# Patient Record
Sex: Male | Born: 1995 | Race: Black or African American | Hispanic: No | Marital: Single | Smoking: Never smoker
Health system: Southern US, Community
[De-identification: ages and names within clinical notes are randomized; demographics above are authoritative.]

## PROBLEM LIST (undated history)

## (undated) HISTORY — PX: NO PAST SURGERIES: SHX2092

---

## 2014-06-01 ENCOUNTER — Emergency Department (HOSPITAL_COMMUNITY)
Admission: EM | Admit: 2014-06-01 | Discharge: 2014-06-01 | Disposition: A | Discharge status: Home / Self Care | Payer: Federal, State, Local not specified - PPO | Attending: Emergency Medicine | Admitting: Emergency Medicine

## 2014-06-01 ENCOUNTER — Encounter (HOSPITAL_COMMUNITY): Payer: Self-pay | Admitting: Emergency Medicine

## 2014-06-01 ENCOUNTER — Emergency Department (HOSPITAL_COMMUNITY): Payer: Federal, State, Local not specified - PPO

## 2014-06-01 DIAGNOSIS — Y929 Unspecified place or not applicable: Secondary | ICD-10-CM | POA: Insufficient documentation

## 2014-06-01 DIAGNOSIS — Y9389 Activity, other specified: Secondary | ICD-10-CM | POA: Diagnosis not present

## 2014-06-01 DIAGNOSIS — S4992XA Unspecified injury of left shoulder and upper arm, initial encounter: Secondary | ICD-10-CM | POA: Diagnosis present

## 2014-06-01 DIAGNOSIS — X58XXXA Exposure to other specified factors, initial encounter: Secondary | ICD-10-CM | POA: Diagnosis not present

## 2014-06-01 DIAGNOSIS — S46912A Strain of unspecified muscle, fascia and tendon at shoulder and upper arm level, left arm, initial encounter: Secondary | ICD-10-CM | POA: Insufficient documentation

## 2014-06-01 MED ORDER — CYCLOBENZAPRINE HCL 10 MG PO TABS: 10.0000 mg | ORAL_TABLET | Freq: Two times a day (BID) | ORAL | Status: AC | PRN

## 2014-06-01 MED ORDER — NAPROXEN 500 MG PO TABS: 500.0000 mg | ORAL_TABLET | Freq: Two times a day (BID) | ORAL | Status: AC

## 2014-06-01 NOTE — ED Notes (Signed)
Pt shoulder pain x 4 days.  Pain score 4/10.  Denies injury.  Denies new exercises or activities.  Sts decreased ROM and intermittent tinging.

## 2014-06-01 NOTE — Discharge Instructions (Signed)
Naprosyn for pain. Flexeril for spasms. Sling for comfort. No physical activity. Follow up with orthopedics specialist.   Shoulder Sprain A shoulder sprain is the result of damage to the tough, fiber-like tissues (ligaments) that help hold your shoulder in place. The ligaments may be stretched or torn. Besides the main shoulder joint (the ball and socket), there are several smaller joints that connect the bones in this area. A sprain usually involves one of those joints. Most often it is the acromioclavicular (or AC) joint. That is the joint that connects the collarbone (clavicle) and the shoulder blade (scapula) at the top point of the shoulder blade (acromion). A shoulder sprain is a mild form of what is called a shoulder separation. Recovering from a shoulder sprain may take some time. For some, pain lingers for several months. Most people recover without long term problems. CAUSES   A shoulder sprain is usually caused by some kind of trauma. This might be:  Falling on an outstretched arm.  Being hit hard on the shoulder.  Twisting the arm.  Shoulder sprains are more likely to occur in people who:  Play sports.  Have balance or coordination problems. SYMPTOMS   Pain when you move your shoulder.  Limited ability to move the shoulder.  Swelling and tenderness on top of the shoulder.  Redness or warmth in the shoulder.  Bruising.  A change in the shape of the shoulder. DIAGNOSIS  Your healthcare provider may:  Ask about your symptoms.  Ask about recent activity that might have caused those symptoms.  Examine your shoulder. You may be asked to do simple exercises to test movement. The other shoulder will be examined for comparison.  Order some tests that provide a look inside the body. They can show the extent of the injury. The tests could include:  X-rays.  CT (computed tomography) scan.  MRI (magnetic resonance imaging) scan. RISKS AND COMPLICATIONS  Loss of full  shoulder motion.  Ongoing shoulder pain. TREATMENT  How long it takes to recover from a shoulder sprain depends on how severe it was. Treatment options may include:  Rest. You should not use the arm or shoulder until it heals.  Ice. For 2 or 3 days after the injury, put an ice pack on the shoulder up to 4 times a day. It should stay on for 15 to 20 minutes each time. Wrap the ice in a towel so it does not touch your skin.  Over-the-counter medicine to relieve pain.  A sling or brace. This will keep the arm still while the shoulder is healing.  Physical therapy or rehabilitation exercises. These will help you regain strength and motion. Ask your healthcare provider when it is OK to begin these exercises.  Surgery. The need for surgery is rare with a sprained shoulder, but some people may need surgery to keep the joint in place and reduce pain. HOME CARE INSTRUCTIONS   Ask your healthcare provider about what you should and should not do while your shoulder heals.  Make sure you know how to apply ice to the correct area of your shoulder.  Talk with your healthcare provider about which medications should be used for pain and swelling.  If rehabilitation therapy will be needed, ask your healthcare provider to refer you to a therapist. If it is not recommended, then ask about at-home exercises. Find out when exercise should begin. SEEK MEDICAL CARE IF:  Your pain, swelling, or redness at the joint increases. SEEK IMMEDIATE MEDICAL CARE  IF:   You have a fever.  You cannot move your arm or shoulder. Document Released: 12/16/2008 Document Revised: 10/22/2011 Document Reviewed: 12/16/2008 [facility] Patient Information 2015 [employer], [place]. This information is not intended to replace advice given to you by your health care provider. Make sure you discuss any questions you have with your health care provider.

## 2014-06-01 NOTE — ED Provider Notes (Signed)
CSN: [id number]     Arrival date & time 06/01/14  0903 History   First MD Initiated Contact with Patient 06/01/14 1003     Chief Complaint  Patient presents with  . Shoulder Pain     (Consider location/radiation/quality/duration/timing/severity/associated sxs/prior Treatment) HPI Dale Gilmore is a 18 y.o. male who presents emergency department complaining of left shoulder pain. 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 States unsure if he injured it. Does not remember any incidents that he can think of. He states pain started 4 days ago. Yesterday pain worsened. He states he has not worked out in the last 4 days. States this morning when he woke up he could not raise his arm. He reports intermittent tingling in his left upper arm, states that has been going on prior to this injury. He denies any neck pain. No current numbness or tingling. He denies any prior shoulder or neck injuries. He states he did play football and fights so he has had multiple minor injuries to does not know if this is related.  History reviewed. No pertinent past medical history. History reviewed. No pertinent past surgical history. History reviewed. No pertinent family history. History  Substance Use Topics  . Smoking status: Never Smoker   . Smokeless tobacco: Not on file  . Alcohol Use: Yes     Comment: occ    Review of Systems  Constitutional: Negative for fever and chills.  Respiratory: Negative for cough, chest tightness and shortness of breath.   Cardiovascular: Negative for chest pain, palpitations and leg swelling.  Musculoskeletal: Positive for arthralgias and joint swelling. Negative for back pain, neck pain and neck stiffness.  Skin: Negative for rash.  Allergic/Immunologic: Negative for immunocompromised state.  Neurological: Positive for numbness. Negative for weakness.      Allergies  Review of patient's allergies indicates not on file.  Home Medications    Prior to Admission medications   Not on File   BP 149/70  Pulse 61  Temp(Src) 98.2 F (36.8 C) (Oral)  Resp 16  SpO2 96% Physical Exam  Nursing note and vitals reviewed. Constitutional: He appears well-developed and well-nourished. No distress.  HENT:  Head: Normocephalic.  Neck: Normal range of motion. Neck supple.  No midline cervical spine tenderness. Full rom of the neck  Cardiovascular: Normal rate, regular rhythm and normal heart sounds.   Pulmonary/Chest: Effort normal and breath sounds normal. No respiratory distress. He has no wheezes. He has no rales.  Abdominal: Soft. There is no tenderness.  Musculoskeletal:  Normal appearing left shoulder. Tender to palpation over anterior joint. No tenderness over posterior joint or acromion. Almost following emotion passively. Unable to raise his arm or abduct his arm past 90 actively. Positive straight arm drop test. Pain with external rotation, minimal pain internal rotation. Patient can put his arm behind his back. Distal radial pulses intact. Bicep and tricep tendon/muscle is intact with normal strength against resistance. Normal sensation in all dermatomes of the hand.  Skin: Skin is warm and dry.    ED Course  Procedures (including critical care time) Labs Review Labs Reviewed - No data to display  Imaging Review Dg Shoulder Left  06/01/2014   CLINICAL DATA:  Left shoulder injury secondary to weight lifting now with generalized left shoulder pain and limited range of motion  EXAM: LEFT SHOULDER - 2+ VIEW  COMPARISON:  None.  FINDINGS: The bones are adequately mineralized. There is no acute fracture nor dislocation. There is  no significant degenerative change. The observed portions of the left clavicle and upper left ribs are normal.  IMPRESSION: There is no acute bony abnormality of the left shoulder.   Electronically Signed   By: David  Swazil[place]   On: 06/01/2014 10:02     EKG Interpretation None      MDM   Final  diagnoses:  Shoulder strain, left, initial encounter   Patient is very active and athletic, left shoulder injury 4 days ago. I'm sure how he injured it. At this time he has limited range of motion actively, pain with external rotation. Neurovascularly intact. X-ray obtained and is negative. Question rotator cuff injury. Will offer a sling, will start her muscle relaxant and anti-inflammatories. Followup with orthopedic specialist if not improving after one week rest.   Filed Vitals:   06/01/14 0934  BP: 149/70  Pulse: 61  Temp: 98.2 F (36.8 C)  TempSrc: Oral  Resp: 16  SpO2: 96%      Sailor Haughn A Tristine Langi, PA-C 06/01/14 1816

## 2014-06-01 NOTE — ED Provider Notes (Signed)
Medical screening examination/treatment/procedure(s) were performed by non-physician practitioner and as supervising physician I was immediately available for consultation/collaboration.   EKG Interpretation None        Shon Batonourtney F Geramy Lamorte, MD 06/01/14 1819

## 2017-07-10 NOTE — Progress Notes (Signed)
Cardiology Office Note    Date:  07/11/2017   ID:  Dale Gilmore, DOB 04-09-1996, MRN [id number]  PCP:  No primary care provider on file.  Cardiologist:  New to [facility] - Dr. Claiborne Billings  Chief Complaint  Patient presents with  . New Patient (Initial Visit)    evaluation of chest pain     History of Present Illness:    Dale Gilmore is a 21 y.o. male with no significant past medical history who presents to the office today for evaluation of chest pain at the request of Dr. Antoine Primas.   In talking with the patient today, he denies any prior cardiac history or past medical history. He has always been very active at baseline and reports playing football and running track throughout high school. He is currently a Equities trader at [employer] and runs 5-6 miles per day and lifts weights on a routine basis. 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  Approximately 6 weeks ago, he started to develop chest discomfort with exertion. He reports this would occur when he was at the highest intensity of his exercise and could last for 30 minutes up to 2 days. He has noticed associated dyspnea. Denies any palpitations, lightheadedness, dizziness, or presyncope. No recent orthopnea, PND, lower extremity edema, or leg pain. Pain was not made worse with positional changes or lying down. He initially tried icing his left pectoral region due to thinking it was a musculoskeletal issue but experienced no improvement in his symptoms with this.   He reports that his mother has a known heart murmur along with high cholesterol but she is adopted and therefore information about his grandparents is not available. He is unaware of his father's medical history. He denies any prior cardiac history as a child. No history of surgeries. He denies any tobacco use or recreational drug use. He does consume alcohol on a social basis. No recent illnesses.   History  reviewed. No pertinent past medical history.  Past Surgical History:  Procedure Laterality Date  . NO PAST SURGERIES      Current Medications: Outpatient Medications Prior to Visit  Medication Sig Dispense Refill  . cyclobenzaprine (FLEXERIL) 10 MG tablet Take 1 tablet (10 mg total) by mouth 2 (two) times daily as needed for muscle spasms. 20 tablet 0  . naproxen (NAPROSYN) 500 MG tablet Take 1 tablet (500 mg total) by mouth 2 (two) times daily. 30 tablet 0   No facility-administered medications prior to visit.      Allergies:   Patient has no allergy information on record.   Social History   Socioeconomic History  . Marital status: Single    Spouse name: None  . Number of children: None  . Years of education: None  . Highest education level: None  Social Needs  . Financial resource strain: None  . Food insecurity - worry: None  . Food insecurity - inability: None  . Transportation needs - medical: None  . Transportation needs - non-medical: None  Occupational History  . None  Tobacco Use  . Smoking status: Never Smoker  . Smokeless tobacco: Never Used  Substance and Sexual Activity  . Alcohol use: Yes    Comment: occ  . Drug use: No  . Sexual activity: None  Other Topics Concern  . None  Social History Narrative  . None     Family History:  The patient's  family history includes Heart murmur in his mother and sister; Hyperlipidemia in his mother.   Review of Systems:   Please see the history of present illness.     General:  No chills, fever, night sweats or weight changes.  Cardiovascular:  No dyspnea on exertion, edema, orthopnea, palpitations, paroxysmal nocturnal dyspnea. Positive for chest pain.  Dermatological: No rash, lesions/masses Respiratory: No cough, dyspnea Urologic: No hematuria, dysuria Abdominal:   No nausea, vomiting, diarrhea, bright red blood per rectum, melena, or hematemesis Neurologic:  No visual changes, wkns, changes in mental  status. All other systems reviewed and are otherwise negative except as noted above.   Physical Exam:    VS:  BP 116/80 (BP Location: Right Arm)   Pulse 66   Ht '5\' 11"'$  (1.803 m)   Wt 195 lb (88.5 kg)   BMI 27.20 kg/m    General: Well developed, well nourished [place] American male appearing in no acute distress. Head: Normocephalic, atraumatic, sclera non-icteric, no xanthomas, nares are without discharge.  Neck: No carotid bruits. JVD not elevated.  Lungs: Respirations regular and unlabored, without wheezes or rales.  Heart: Regular rate and rhythm. No S3 or S4.  No murmur, no rubs, or gallops appreciated. Abdomen: Soft, non-tender, non-distended with normoactive bowel sounds. No hepatomegaly. No rebound/guarding. No obvious abdominal masses. Msk:  Strength and tone appear normal for age. No joint deformities or effusions. Extremities: No clubbing or cyanosis. No lower extremity edema.  Distal pedal pulses are 2+ bilaterally. Neuro: Alert and oriented X 3. Moves all extremities spontaneously. No focal deficits noted. Psych:  Responds to questions appropriately with a normal affect. Skin: No rashes or lesions noted  Wt Readings from Last 3 Encounters:  07/11/17 195 lb (88.5 kg)     Studies/Labs Reviewed:   EKG:  EKG is ordered today.The ekg ordered today demonstrates NSR, HR 66, with mild LVH and slight up-sloping of the ST segment along V2-V6 (similar to prior tracing from 07/10/17)  Recent Labs: No results found for requested labs within last 8760 hours.   Lipid Panel No results found for: CHOL, TRIG, HDL, CHOLHDL, VLDL, LDLCALC, LDLDIRECT  Additional studies/ records that were reviewed today include:   No prior studies available for review.   Assessment:    1. Precordial pain   2. Dyspnea on exertion      Plan:   In order of problems listed above:  1. Precordial Chest Pain/ Dyspnea on Exertion - the patient has no prior cardiac history and family history is  limited due to the patient being unaware of his father's history. He has been experiencing chest pain with activity for the past several weeks which only comes on with strenuous activity. He has still been able to run and lift weights without the symptoms recurring. The pain is not exacerbated with positional changes and can last for 30 minutes up to 2 days. He does travel regularly but he denies any leg pain and his respiratory status has been at baseline except for mild dyspnea when the pain occurs.  - overall, his symptoms seem atypical for pericarditis but will check ESR and CRP to rule this out. Doubt an ischemic etiology in a patient this young of age and active lifestyle. Will obtain an echocardiogram to rule out structural abnormalities and to assess for LVH as his EKG shows evidence of early repolarization abnormalities. If echo is without abnormalities and symptoms persist, consider a GXT. With the patient being new to our practice, this was  reviewed with Dr. Claiborne Billings (DOD) who was in agreement with the assessment and plan.    Medication Adjustments/Labs and Tests Ordered: Current medicines are reviewed at length with the patient today.  Concerns regarding medicines are outlined above.  Medication changes, Labs and Tests ordered today are listed in the Patient Instructions below. Patient Instructions  Medication Instructions:  Your physician recommends that you continue on your current medications as directed. Please refer to the Current Medication list given to you today.  Labwork: Your physician recommends that you return for lab work in: TODAY-ESR, CRP   Testing/Procedures: Your physician has requested that you have an echocardiogram. Echocardiography is a painless test that uses sound waves to create images of your heart. It provides your doctor with information about the size and shape of your heart and how well your heart's chambers and valves are working. This procedure takes  approximately one hour. There are no restrictions for this procedure.  [place]  Follow-Up: Follow up with Dr Claiborne Billings as needed unless ECHO comes back abnormal   Any Other Special Instructions Will Be Listed Below (If Applicable).  If you need a refill on your cardiac medications before your next appointment, please call your pharmacy.   Signed, Erma Heritage, PA-C  07/11/2017 7:11 PM    [facility] [address], [place], [place]  [address] Phone: [phone]; Fax: [phone]  [address], [place],  [address] Phone: [phone]

## 2017-07-11 ENCOUNTER — Ambulatory Visit (INDEPENDENT_AMBULATORY_CARE_PROVIDER_SITE_OTHER): Payer: Federal, State, Local not specified - PPO | Admitting: Student

## 2017-07-11 ENCOUNTER — Encounter: Payer: Self-pay | Admitting: Student

## 2017-07-11 VITALS — BP 116/80 | HR 66 | Ht 71.0 in | Wt 195.0 lb

## 2017-07-11 DIAGNOSIS — R072 Precordial pain: Secondary | ICD-10-CM

## 2017-07-11 DIAGNOSIS — R0609 Other forms of dyspnea: Secondary | ICD-10-CM

## 2017-07-11 NOTE — Patient Instructions (Signed)
Medication Instructions:  Your physician recommends that you continue on your current medications as directed. Please refer to the Current Medication list given to you today.  Labwork: Your physician recommends that you return for lab work in: TODAY-ESR, CRP   Testing/Procedures: Your physician has requested that you have an echocardiogram. Echocardiography is a painless test that uses sound waves to create images of your heart. It provides your doctor with information about the size and shape of your heart and how well your heart's chambers and valves are working. This procedure takes approximately one hour. There are no restrictions for this procedure.  [address]  Follow-Up: Follow up with Dr Kelly as needed unless ECHO comes back abnormal   Any Other Special Instructions Will Be Listed Below (If Applicable).     If you need a refill on your cardiac medications before your next appointment, please call your pharmacy.   

## 2017-07-12 ENCOUNTER — Ambulatory Visit (HOSPITAL_COMMUNITY): Payer: Federal, State, Local not specified - PPO | Attending: Cardiology

## 2017-07-12 ENCOUNTER — Other Ambulatory Visit: Payer: Self-pay

## 2017-07-12 DIAGNOSIS — R072 Precordial pain: Secondary | ICD-10-CM | POA: Diagnosis not present

## 2017-07-12 DIAGNOSIS — R0600 Dyspnea, unspecified: Secondary | ICD-10-CM | POA: Diagnosis not present

## 2017-07-12 DIAGNOSIS — R0609 Other forms of dyspnea: Secondary | ICD-10-CM | POA: Insufficient documentation

## 2017-07-12 LAB — C-REACTIVE PROTEIN: CRP: <0.3 mg/L (ref 0.0–4.9)

## 2017-07-12 LAB — SEDIMENTATION RATE: Sed Rate: 5 mm/hr (ref 0–15)

## 2017-08-11 ENCOUNTER — Emergency Department (HOSPITAL_COMMUNITY): Payer: Federal, State, Local not specified - PPO

## 2017-08-11 ENCOUNTER — Other Ambulatory Visit: Payer: Self-pay

## 2017-08-11 ENCOUNTER — Emergency Department (HOSPITAL_COMMUNITY)
Admission: EM | Admit: 2017-08-11 | Discharge: 2017-08-11 | Disposition: A | Discharge status: Home / Self Care | Payer: Federal, State, Local not specified - PPO | Attending: Emergency Medicine | Admitting: Emergency Medicine

## 2017-08-11 ENCOUNTER — Encounter (HOSPITAL_COMMUNITY): Payer: Self-pay | Admitting: *Deleted

## 2017-08-11 DIAGNOSIS — R0602 Shortness of breath: Secondary | ICD-10-CM | POA: Insufficient documentation

## 2017-08-11 DIAGNOSIS — R0789 Other chest pain: Secondary | ICD-10-CM | POA: Diagnosis present

## 2017-08-11 DIAGNOSIS — R072 Precordial pain: Secondary | ICD-10-CM | POA: Diagnosis not present

## 2017-08-11 DIAGNOSIS — Z79899 Other long term (current) drug therapy: Secondary | ICD-10-CM | POA: Insufficient documentation

## 2017-08-11 LAB — CBC
HCT: 46.2 % (ref 39.0–52.0)
Hemoglobin: 16.1 g/dL (ref 13.0–17.0)
MCH: 32.8 pg (ref 26.0–34.0)
MCHC: 34.8 g/dL (ref 30.0–36.0)
MCV: 94.1 fL (ref 78.0–100.0)
Platelets: 235 10*3/uL (ref 150–400)
RBC: 4.91 MIL/uL (ref 4.22–5.81)
RDW: 11.8 % (ref 11.5–15.5)
WBC: 7.3 10*3/uL (ref 4.0–10.5)

## 2017-08-11 LAB — BASIC METABOLIC PANEL
Anion gap: 7 (ref 5–15)
BUN: 14 mg/dL (ref 6–20)
CO2: 29 mmol/L (ref 22–32)
Calcium: 9.7 mg/dL (ref 8.9–10.3)
Chloride: 101 mmol/L (ref 101–111)
Creatinine, Ser: 1.05 mg/dL (ref 0.61–1.24)
GFR calc Af Amer: >60 mL/min (ref >60)
GFR calc non Af Amer: >60 mL/min (ref >60)
Glucose, Bld: 88 mg/dL (ref 65–99)
Potassium: 4.2 mmol/L (ref 3.5–5.1)
Sodium: 137 mmol/L (ref 135–145)

## 2017-08-11 LAB — D-DIMER, QUANTITATIVE (NOT AT ARMC)

## 2017-08-11 LAB — I-STAT TROPONIN, ED: Troponin i, poc: 0 ng/mL (ref 0.00–0.08)

## 2017-08-11 NOTE — ED Triage Notes (Signed)
Pt has had chest pain for over a month, has been to cardiology and nothing was noted, pain is reproducible and more muscular in description, Increased pain with movement and deep breaths, pain worse last night, he feels there is something wrong and had been overlooked.

## 2017-08-11 NOTE — ED Provider Notes (Signed)
[facility] DEPT Provider Note   CSN: [id number] Arrival date & time: 08/11/17  1058     History   Chief Complaint Chief Complaint  Patient presents with  . Chest Pain    HPI Dale Gilmore is a 21 y.o. male.  The history is provided by the patient and medical records. No language interpreter was used.  Chest Pain   Associated symptoms include shortness of breath. Pertinent negatives include no palpitations.   Dale Gilmore is an otherwise healthy 21 y.o. male who presents to the Emergency Department complaining of intermittent left-sided chest pain over the last 2 months.  When pain occurs, he feels short of breath. Patient states the pain is worse with movement and when he takes a deep breath.  Initially, this was happening every 3-4 days, however over the last week, he has been experiencing this pain daily, mostly at night when he gets to bed.  At first, he thought this was just a sore muscle, therefore was taking over-the-counter pain medication and applying heat to the area.  He was also trying massage, however this was not helping. He was seen at outside emergency department initially and told everything was fine. He was still experiencing symptoms, therefore scheduled an appointment with a cardiologist.  He states they took lab work and did an ultrasound which was normal.  Per chart review, ESR and CRP were done to rule out pericarditis-these were normal.  Echo was also done which was normal.  It appears per chart reviewed that if this workup was negative and symptoms persisted, they would consider getting an exercise stress test. No leg pain or swelling. No recent surgeries or immobilizations.  No history of PE/DVT or clotting disorder.  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  Most recently he flew to the [place]3 weeks ago.   History reviewed. No pertinent past medical history.  There are no active  problems to display for this patient.   Past Surgical History:  Procedure Laterality Date  . NO PAST SURGERIES         Home Medications    Prior to Admission medications   Medication Sig Start Date End Date Taking? Authorizing Provider  cyclobenzaprine (FLEXERIL) 10 MG tablet Take 1 tablet (10 mg total) by mouth 2 (two) times daily as needed for muscle spasms. 06/01/14   Kirichenko, Tatyana, PA-C  naproxen (NAPROSYN) 500 MG tablet Take 1 tablet (500 mg total) by mouth 2 (two) times daily. 06/01/14   Jeannett Senior, PA-C    Family History Family History  Problem Relation Age of Onset  . Hyperlipidemia Mother   . Heart murmur Mother   . Heart murmur Sister     Social History Social History   Tobacco Use  . Smoking status: Never Smoker  . Smokeless tobacco: Never Used  Substance Use Topics  . Alcohol use: Yes    Comment: occ  . Drug use: No     Allergies   Patient has no known allergies.   Review of Systems Review of Systems  Respiratory: Positive for shortness of breath.   Cardiovascular: Positive for chest pain. Negative for palpitations and leg swelling.  All other systems reviewed and are negative.    Physical Exam Updated Vital Signs BP (!) 126/57 (BP Location: Right Arm)   Pulse (!) 58   Temp 98.2 F (36.8 C) (Oral)   Resp 18   Ht _0  (1.803 m)   Wt  88.5 kg (195 lb)   SpO2 99%   BMI 27.20 kg/m   Physical Exam  Constitutional: He is oriented to person, place, and time. He appears well-developed and well-nourished. No distress.  HENT:  Head: Normocephalic and atraumatic.  Neck: Neck supple. No JVD present.  Cardiovascular: Normal rate, regular rhythm and normal heart sounds.  No murmur heard. Pulmonary/Chest: Effort normal and breath sounds normal. No stridor. No respiratory distress. He has no wheezes. He has no rales. He exhibits tenderness (Left side of the chest).  Abdominal: Soft. He exhibits no distension. There is no tenderness.   Musculoskeletal: He exhibits no edema.  Neurological: He is alert and oriented to person, place, and time.  Skin: Skin is warm and dry.  Nursing note and vitals reviewed.    ED Treatments / Results  Labs (all labs ordered are listed, but only abnormal results are displayed) Labs Reviewed  BASIC METABOLIC PANEL  CBC  D-DIMER, QUANTITATIVE (NOT AT [facility])  I-STAT TROPONIN, ED    EKG  EKG Interpretation  Date/Time:  Sunday August 11 2017 11:10:26 EST Ventricular Rate:  64 PR Interval:    QRS Duration: 79 QT Interval:  389 QTC Calculation: 402 R Axis:   67 Text Interpretation:  Sinus rhythm ST elev, probable normal early repol pattern No significant change since last tracing 07/10/17 Confirmed by Virgel Manifold [phone]) on 08/11/2017 12:19:07 PM       Radiology Dg Chest 2 View  Result Date: 08/11/2017 CLINICAL DATA:  Left-sided chest pain for 6 weeks. EXAM: CHEST  2 VIEW COMPARISON:  None FINDINGS: The heart size and mediastinal contours are within normal limits. Both lungs are clear. The visualized skeletal structures are unremarkable. IMPRESSION: No active cardiopulmonary disease. Electronically Signed   By: Kerby Moors M.D.   On: 08/11/2017 11:29    Procedures Procedures (including critical care time)  Medications Ordered in ED Medications - No data to display   Initial Impression / Assessment and Plan / ED Course  I have reviewed the triage vital signs and the nursing notes.  Pertinent labs & imaging results that were available during my care of the patient were reviewed by me and considered in my medical decision making (see chart for details).    Dale Gilmore is a 21 y.o. male who presents to ED for chest pain associated with shortness of breath x 2 months. Tenderness to the left chest wall, but otherwise normal exam.   Labs reviewed and reassuring with negative troponin and d-dimer.   CXR with no acute abnormalities.  EKG unchanged from previous. Low  risk heart score.   Per chart review, it appears that cardiology was to consider exercise stress test if symptoms persisted and echo/ESR/CRP all returned normal.  Considering his persistent symptoms, will refer back to cardiology for further evaluation and possible stress test.  Evaluation does not show pathology that would require ongoing emergent intervention or inpatient treatment.Referral information included in discharge paperwork. Patient understands return precautions and follow up plan. All questions answered.  Final Clinical Impressions(s) / ED Diagnoses   Final diagnoses:  Precordial chest pain    ED Discharge Orders    None       Ward, Ozella Almond, PA-C 08/11/17 1354    Virgel Manifold, MD 08/12/17 [phone]

## 2017-08-11 NOTE — Discharge Instructions (Signed)
It was my pleasure taking care of you today!   You were seen in the Emergency Department today for chest pain.  As we have discussed, today?s blood work and imaging are normal, but you may require further testing.  Please call the cardiology listed to schedule a follow up appointment for further evaluation of the chest pain you are experiencing.   Return to the Emergency Department if you experience any further chest pain/pressure/tightness, difficulty breathing, sudden sweating, or other symptoms that concern you.

## 2017-08-12 ENCOUNTER — Telehealth: Payer: Self-pay | Admitting: Student

## 2017-08-12 DIAGNOSIS — R072 Precordial pain: Secondary | ICD-10-CM

## 2017-08-12 NOTE — Telephone Encounter (Signed)
New message  Pt verbalized that he was at the ER and that the following test below are needed. Please call pt he is trying to get them done today.  Per chart review, it appears that cardiology was to consider exercise stress test if symptoms persisted and echo/ESR/CRP all returned normal.  Considering his persistent symptoms, will refer back to cardiology for further evaluation and possible stress test.

## 2017-08-12 NOTE — Telephone Encounter (Signed)
Returned call to patient.He stated he went to ED yesterday with chest pain.Stated he continues to have chest pain this morning.Stated he was told he needs a stress test.Advised I will speak to DOD Dr.Hilty. Spoke to Dr.Hilty he advised schedule a treadmill test.Advised scheduler will call back to schedule.

## 2017-08-14 ENCOUNTER — Telehealth (HOSPITAL_COMMUNITY): Payer: Self-pay

## 2017-08-14 NOTE — Telephone Encounter (Signed)
UTR Encounter complete. 

## 2017-08-15 ENCOUNTER — Telehealth (HOSPITAL_COMMUNITY): Payer: Self-pay

## 2017-08-15 NOTE — Telephone Encounter (Signed)
Encounter complete. 

## 2017-08-16 ENCOUNTER — Encounter (HOSPITAL_COMMUNITY): Payer: Self-pay | Admitting: *Deleted

## 2017-08-16 ENCOUNTER — Ambulatory Visit (HOSPITAL_COMMUNITY)
Admission: RE | Admit: 2017-08-16 | Discharge: 2017-08-16 | Disposition: A | Discharge status: Home / Self Care | Payer: Federal, State, Local not specified - PPO | Source: Ambulatory Visit | Attending: Cardiovascular Disease | Admitting: Cardiovascular Disease

## 2017-08-16 DIAGNOSIS — R072 Precordial pain: Secondary | ICD-10-CM | POA: Diagnosis present

## 2017-08-16 LAB — EXERCISE TOLERANCE TEST
CHL CUP RESTING HR STRESS: 59 {beats}/min
CSEPHR: 95 %
CSEPPHR: 190 {beats}/min
Estimated workload: 15.9 METS
Exercise duration (min): 13 min
Exercise duration (sec): 26 s
MPHR: 198 {beats}/min
RPE: 18

## 2017-08-16 NOTE — Progress Notes (Unsigned)
ABNORMAL EKG CHANGES DURING ETT. STUDY WAS REVIEWED BY DR. Allyson SabalBERRY. PATIENT WAS GIVEN THE OK TO BE D/C TO GO HOME.

## 2019-08-15 IMAGING — CR DG CHEST 2V
2 series · 2 of 2 positions shown · non-contrast
Comparison: None

CLINICAL DATA: Left-sided chest pain for 6 weeks.

EXAM:
CHEST  2 VIEW

[w chest pa]
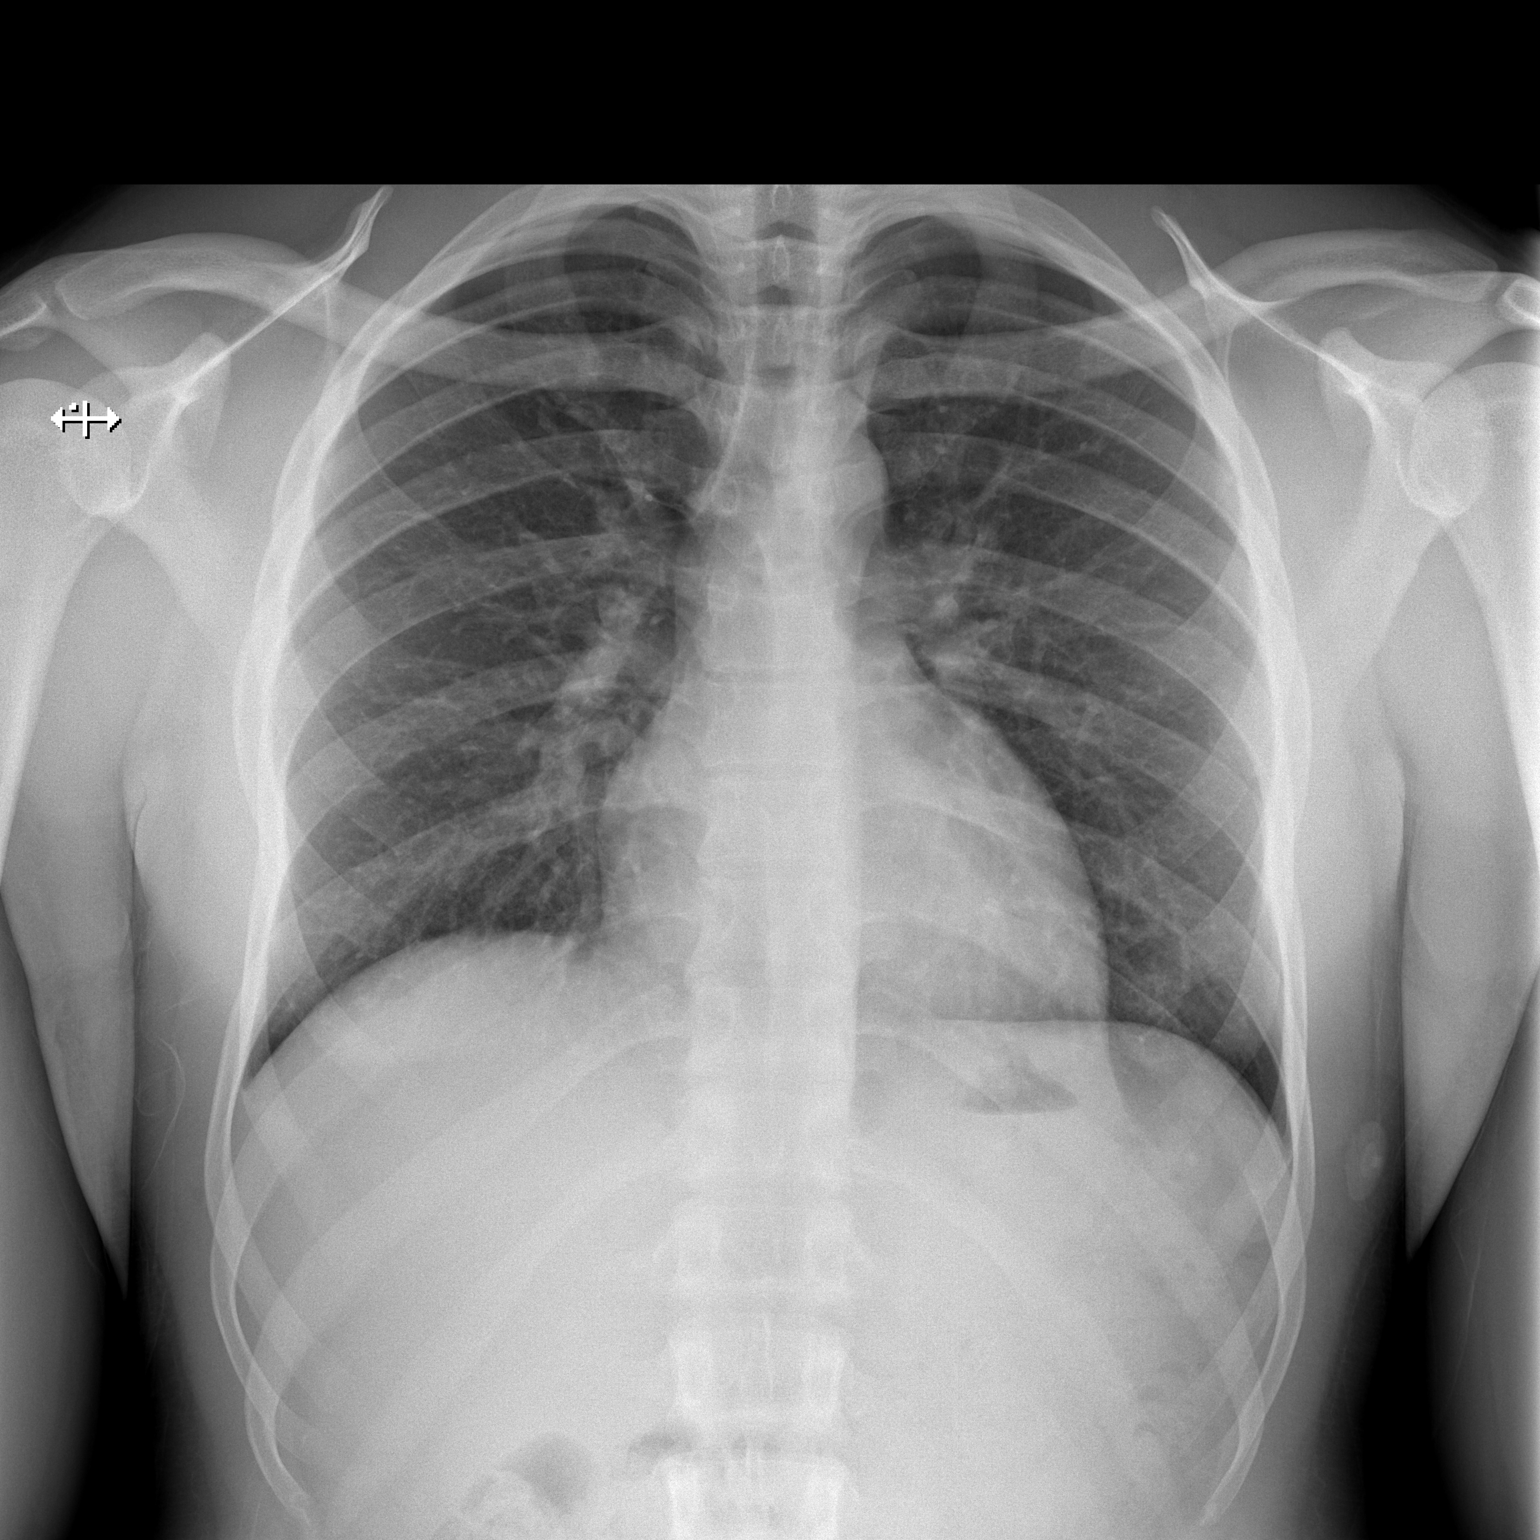

[w chest lat]
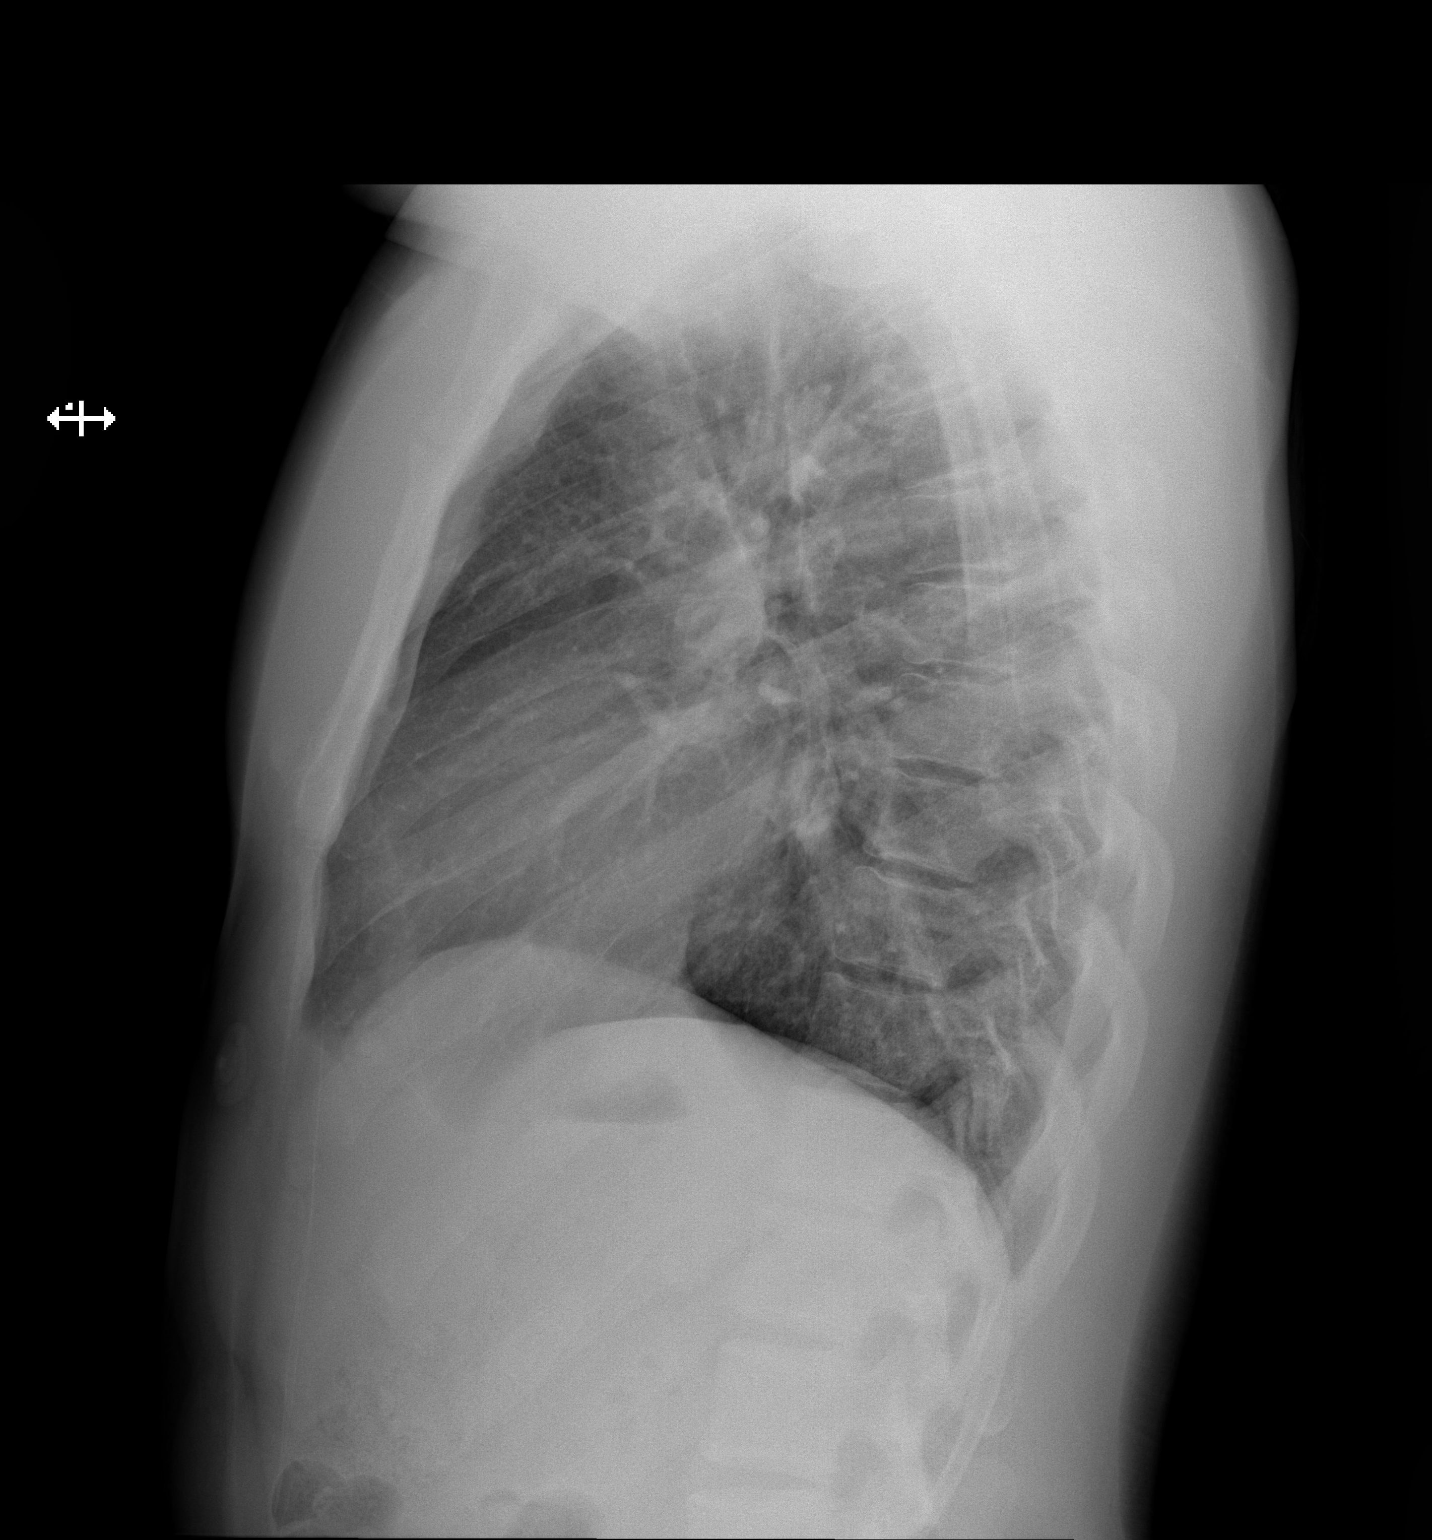

[2 of 2 positions shown; findings below may reference images not displayed]

FINDINGS: The heart size and mediastinal contours are within normal limits.
Both lungs are clear. The visualized skeletal structures are
unremarkable.
IMPRESSION: No active cardiopulmonary disease.
